# Patient Record
Sex: Male | Born: 1990 | Race: White | Hispanic: No | Marital: Single | Smoking: Current every day smoker
Health system: Southern US, Community
[De-identification: ages and names within clinical notes are randomized; demographics above are authoritative.]

## PROBLEM LIST (undated history)

## (undated) DIAGNOSIS — J189 Pneumonia, unspecified organism: Secondary | ICD-10-CM

---
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
## 2021-10-08 ENCOUNTER — Ambulatory Visit (HOSPITAL_COMMUNITY)
Admission: EM | Admit: 2021-10-08 | Discharge: 2021-10-08 | Disposition: A | Discharge status: Home / Self Care | Payer: No Typology Code available for payment source

## 2021-10-08 ENCOUNTER — Encounter (HOSPITAL_COMMUNITY): Payer: Self-pay

## 2021-10-08 DIAGNOSIS — K439 Ventral hernia without obstruction or gangrene: Secondary | ICD-10-CM

## 2021-10-08 DIAGNOSIS — R109 Unspecified abdominal pain: Secondary | ICD-10-CM

## 2021-10-08 NOTE — Discharge Instructions (Signed)
I recommend following up with your new primary care provider. They will be able to offer you further evaluation and imaging if needed.  If you develop any pain or notice a "bulge" that does not press back in, please seek further care.

## 2021-10-08 NOTE — ED Provider Notes (Signed)
[facility]    CSN: [id number] Arrival date & time: 10/08/21  1326     History   Chief Complaint Chief Complaint  Patient presents with   Hernia    HPI Jeffrey Tanner is a 31 y.o. male.  Presents with concern for possible abdominal hernia that he noticed first in April. Notices mostly with walking. Does not notice a bulge or any lump in the area. Just feels discomfort above the umbilicus.  Denies fever, abdominal pain, nausea, vomiting, diarrhea, constipation. No pain when he presses on the area. No history of hernia.  He is otherwise asymptomatic.   History reviewed. No pertinent past medical history.  There are no problems to display for this patient.  History reviewed. No pertinent surgical history.   Home Medications    Prior to Admission medications   Not on File    Family History History reviewed. No pertinent family history.  Social History Social History   Tobacco Use   Smoking status: Every Day  Substance Use Topics   Alcohol use: No     Allergies   Patient has no known allergies.   Review of Systems Review of Systems Per HPI  Physical Exam Triage Vital Signs ED Triage Vitals  Enc Vitals Group     BP 10/08/21 1453 137/69     Pulse Rate 10/08/21 1453 65     Resp 10/08/21 1453 14     Temp 10/08/21 1453 98.1 F (36.7 C)     Temp Source 10/08/21 1453 Oral     SpO2 10/08/21 1453 100 %     Weight --      Height --      Head Circumference --      Peak Flow --      Pain Score 10/08/21 1452 0     Pain Loc --      Pain Edu? --      Excl. in GC? --    No data found.  Updated Vital Signs BP 137/69 (BP Location: Left Arm)   Pulse 65   Temp 98.1 F (36.7 C) (Oral)   Resp 14   SpO2 100%    Physical Exam Vitals and nursing note reviewed.  Constitutional:      General: He is not in acute distress.    Appearance: Normal appearance.  HENT:     Mouth/Throat:     Pharynx: Oropharynx is clear.  Eyes:     Conjunctiva/sclera:  Conjunctivae normal.  Cardiovascular:     Rate and Rhythm: Normal rate and regular rhythm.     Pulses: Normal pulses.     Heart sounds: Normal heart sounds.  Pulmonary:     Effort: Pulmonary effort is normal.     Breath sounds: Normal breath sounds.  Abdominal:     General: There is no distension.     Palpations: There is no mass.     Tenderness: There is no abdominal tenderness. There is no guarding or rebound.     Hernia: No hernia is present.     Comments: Small soft area just superior to umbilicus. No mass, protrusion, lump, etc. Non tender. Almost unnoticeable  Skin:    General: Skin is warm and dry.  Neurological:     Mental Status: He is alert and oriented to person, place, and time.      UC Treatments / Results  Labs (all labs ordered are listed, but only abnormal results are displayed) Labs Reviewed - No data to display  EKG   Radiology No results found.  Procedures Procedures (including critical care time)  Medications Ordered in UC Medications - No data to display  Initial Impression / Assessment and Plan / UC Course  I have reviewed the triage vital signs and the nursing notes.  Pertinent labs & imaging results that were available during my care of the patient were reviewed by me and considered in my medical decision making (see chart for details).  Very minor almost "squishy" area could be hernia forming although no concern for any incarceration or strangulation at this time. Even with muscle flexion, no noticeable area of concern. Discussed with patient he should establish with primary care and see if they recommend further workup, at this time seems benign. Discussed signs to look for that would require emergency eval.  Tried to schedule PCP appointment in the exam room but system declined. He will schedule at home and follow up with them once established. Return precautions discussed. Patient agrees to plan and is discharged in stable condition.  Final  Clinical Impressions(s) / UC Diagnoses   Final diagnoses:  Abdominal discomfort  Hernia of abdominal wall     Discharge Instructions      I recommend following up with your new primary care provider. They will be able to offer you further evaluation and imaging if needed.  If you develop any pain or notice a "bulge" that does not press back in, please seek further care.    ED Prescriptions   None    PDMP not reviewed this encounter.   Jawan Chavarria, Lurena Joiner, [place] 10/08/21 1604

## 2021-10-08 NOTE — ED Triage Notes (Signed)
Pt presents with c/o of a possible abdominal hernia that has been present since April. Pt endorses discomfort.

## 2022-02-25 ENCOUNTER — Encounter: Payer: Self-pay | Admitting: Gastroenterology

## 2022-02-25 ENCOUNTER — Ambulatory Visit (INDEPENDENT_AMBULATORY_CARE_PROVIDER_SITE_OTHER): Payer: No Typology Code available for payment source | Admitting: Gastroenterology

## 2022-02-25 VITALS — BP 127/74 | HR 105 | Temp 99.4°F | Ht 71.0 in | Wt 159.8 lb

## 2022-02-25 DIAGNOSIS — K429 Umbilical hernia without obstruction or gangrene: Secondary | ICD-10-CM

## 2022-02-25 DIAGNOSIS — R1033 Periumbilical pain: Secondary | ICD-10-CM | POA: Diagnosis not present

## 2022-02-25 NOTE — Progress Notes (Signed)
    Wyline Mood MD, MRCP(U.K) [address]  [address]  [place], [place] [address]  Main: [phone]  Fax: [phone]   Gastroenterology Consultation  Referring Provider:   Emergency room Primary Care Physician:  Pcp, No Primary Gastroenterologist:  Dr. Wyline Mood  Reason for Consultation: GI symptoms        HPI:   Jeffrey Tanner is a 31 y.o. y/o male was seen in the ER on 10/08/2021 for a hernia.  He complained to the emergency room that he had some discomfort over his umbilicus.  No imaging was performed was discharged and asked to see me I have reviewed care everywhere no labs or imaging  He states the main issue he is here today to see me is for a bulge on his abdominal wall just above his umbilicus noticed for the past few months does not cause any pain no discomfort has noticed that recently he lifts heavy loads. No past medical history on file.  No past surgical history on file.  Prior to Admission medications   Not on File    No family history on file.   Social History   Tobacco Use   Smoking status: Every Day  Substance Use Topics   Alcohol use: No    Allergies as of 02/25/2022   (No Known Allergies)    Review of Systems:    All systems reviewed and negative except where noted in HPI.   Physical Exam:  BP 127/74   Pulse (!) 105   Temp 99.4 F (37.4 C) (Oral)   Ht 5\' 11"  (1.803 m)   Wt 159 lb 12.8 oz (72.5 kg)   BMI 22.29 kg/m  No LMP for male patient. Psych:  Alert and cooperative. Normal mood and affect. General:   Alert,  Well-developed, well-nourished, pleasant and cooperative in NAD Head:  Normocephalic and atraumatic. Eyes:  Sclera clear, no icterus.   Conjunctiva pink. Ears:  Normal auditory acuity.  Abdomen: Exam the area just above his umbilicus he has very toned muscles particularly the rectus abdominis.  I could not particularly feel any bulge on flexion of at his hip when he tried to sit up or whether he coughed.  There was probably  gap in the midline but no palpable mass Neurologic:  Alert and oriented x3;  grossly normal neurologically. Psych:  Alert and cooperative. Normal mood and affect.  Imaging Studies: No results found.  Assessment and Plan:   Jeffrey Tanner is a 31 y.o. y/o male has been referred for a possible hernia on examination no abnormalities felt in the area perceived by the patient about the umbilicus I believe this may be a defect in the fascia and the 2 layers of the rectal abdominis muscle converging.  I cannot feel any palpable abnormality to reassure him I will get focused ultrasound of this area and confirm my findings  Follow up in 8 to 12 weeks  Dr 37 MD,MRCP(U.K)

## 2022-02-25 NOTE — Patient Instructions (Signed)
[facility] at 7:45 AM. Please do not eat or drink after midnight. If you need to reschedule, please call [phone].

## 2022-02-28 ENCOUNTER — Ambulatory Visit
Admission: RE | Admit: 2022-02-28 | Discharge: 2022-02-28 | Disposition: A | Discharge status: Home / Self Care | Payer: No Typology Code available for payment source | Source: Ambulatory Visit | Attending: Gastroenterology | Admitting: Gastroenterology

## 2022-02-28 DIAGNOSIS — R1033 Periumbilical pain: Secondary | ICD-10-CM | POA: Diagnosis not present

## 2022-02-28 NOTE — Progress Notes (Signed)
Inform he has a hernia and if he wishes we can refer him to Dr Aleen Campi for surgeical repair. No follow up with me needed

## 2022-03-04 ENCOUNTER — Telehealth: Payer: Self-pay

## 2022-03-04 NOTE — Telephone Encounter (Signed)
-----   Message from Wyline Mood, MD sent at 02/28/2022  1:03 PM EST ----- Inform he has a hernia and if he wishes we can refer him to Dr Aleen Campi for surgeical repair. No follow up with me needed

## 2022-03-04 NOTE — Telephone Encounter (Signed)
Called patient but had to leave him a detailed message letting him know that the abdominal ultrasound showed that he had an umbilical hernia. I also asked for him to call me back if he wanted to be referred to a general surgeon so it could get repaired.

## 2022-04-03 NOTE — Addendum Note (Signed)
Addended by: Wayna Chalet on: 04/03/2022 10:27 AM   Modules accepted: Orders

## 2022-04-10 ENCOUNTER — Encounter: Payer: Self-pay | Admitting: Surgery

## 2022-04-10 ENCOUNTER — Ambulatory Visit (INDEPENDENT_AMBULATORY_CARE_PROVIDER_SITE_OTHER): Payer: 59 | Admitting: Surgery

## 2022-04-10 ENCOUNTER — Telehealth: Payer: Self-pay | Admitting: Surgery

## 2022-04-10 VITALS — BP 136/85 | HR 93 | Temp 98.2°F | Ht 73.0 in | Wt 161.0 lb

## 2022-04-10 DIAGNOSIS — K429 Umbilical hernia without obstruction or gangrene: Secondary | ICD-10-CM

## 2022-04-10 NOTE — Patient Instructions (Addendum)
Our surgery scheduler Barbara will call you within 24-48 hours to get you scheduled. If you have not heard from her after 48 hours, please call our office. Have the blue sheet available when she calls to write down important information.   If you have any concerns or questions, please feel free to call our office.   Umbilical Hernia, Adult  A hernia is a bulge of tissue that pushes through an opening between muscles. An umbilical hernia happens in the abdomen, near the belly button (umbilicus). The hernia may contain tissues from the small intestine, large intestine, or fatty tissue covering the intestines. Umbilical hernias in adults tend to get worse over time, and they require surgical treatment. There are different types of umbilical hernias, including: Indirect hernia. This type is located just above or below the umbilicus. It is the most common type of umbilical hernia in adults. Direct hernia. This type forms through an opening formed by the umbilicus. Reducible hernia. This type of hernia comes and goes. It may be visible only when you strain, lift something heavy, or cough. This type of hernia can be pushed back into the abdomen (reduced). Incarcerated hernia. This type traps abdominal tissue inside the hernia. This type of hernia cannot be reduced. Strangulated hernia. This type of hernia cuts off blood flow to the tissues inside the hernia. The tissues can start to die if this happens. This type of hernia requires emergency treatment. What are the causes? An umbilical hernia happens when tissue inside the abdomen presses on a weak area of the abdominal muscles. What increases the risk? You may have a greater risk of this condition if you: Are obese. Have had several pregnancies. Have a buildup of fluid inside your abdomen. Have had surgery that weakens the abdominal muscles. What are the signs or symptoms? The main symptom of this condition is a painless bulge at or near the belly  button. A reducible hernia may be visible only when you strain, lift something heavy, or cough. Other symptoms may include: Dull pain. A feeling of pressure. Symptoms of a strangulated hernia may include: Pain that gets increasingly worse. Nausea and vomiting. Pain when pressing on the hernia. Skin over the hernia becoming red or purple. Constipation. Blood in the stool. How is this diagnosed? This condition may be diagnosed based on: A physical exam. You may be asked to cough or strain while standing. These actions increase the pressure inside your abdomen and can force the hernia through the opening in your muscles. Your health care provider may try to reduce the hernia by pressing on it. Your symptoms and medical history. How is this treated? Surgery is the only treatment for an umbilical hernia. Surgery for a strangulated hernia is done as soon as possible. If you have a small hernia that is not incarcerated, you may need to lose weight before having surgery. Follow these instructions at home: Lose weight, if told by your health care provider. Do not try to push the hernia back in. Watch your hernia for any changes in color or size. Tell your health care provider if any changes occur. You may need to avoid activities that increase pressure on your hernia. Do not lift anything that is heavier than 10 lb (4.5 kg), or the limit that you are told, until your health care provider says that it is safe. Take over-the-counter and prescription medicines only as told by your health care provider. Keep all follow-up visits. This is important. Contact a health care   provider if: Your hernia gets larger. Your hernia becomes painful. Get help right away if: You develop sudden, severe pain near the area of your hernia. You have pain as well as nausea or vomiting. You have pain and the skin over your hernia changes color. You develop a fever or chills. Summary A hernia is a bulge of tissue that  pushes through an opening between muscles. An umbilical hernia happens near the belly button. Surgery is the only treatment for an umbilical hernia. Do not try to push your hernia back in. Keep all follow-up visits. This is important. This information is not intended to replace advice given to you by your health care provider. Make sure you discuss any questions you have with your health care provider. Document Revised: 10/25/2019 Document Reviewed: 10/25/2019 Elsevier Patient Education  2023 Elsevier Inc.  

## 2022-04-10 NOTE — Progress Notes (Signed)
04/10/2022  Reason for Visit: Umbilical hernia  Requesting Provider: Jonathon Bellows, MD  History of Present Illness: Jeffrey Tanner is a 32 y.o. male presenting for evaluation of an umbilical hernia.  The patient reports that since about April 2023 he has noted an area of discomfort at the umbilicus associated with bulging that is intermittent.  He works in [employer] and does heavy lifting as part of his job.  He reports that sometimes the area at the umbilicus is bulging but sometimes is reduced.  He denies any episodes of severe pain or firmness at the umbilicus.  Denies any nausea or vomiting, constipation or diarrhea.  He saw Dr. Vicente Males on 02/25/2022 and ultrasound of his abdomen was obtained on 02/28/2022 which showed a fat-containing umbilical hernia with a small defect size of 9 mm.  Past Medical History: History reviewed. No pertinent past medical history.   Past Surgical History: History reviewed. No pertinent surgical history.  Home Medications: Prior to Admission medications   None    Allergies: No Known Allergies  Social History:  reports that he has been smoking. He does not have any smokeless tobacco history on file. He reports that he does not drink alcohol. No history on file for drug use.   Family History: History reviewed. No pertinent family history.  Review of Systems: Review of Systems  Constitutional:  Negative for chills and fever.  HENT:  Negative for hearing loss.   Respiratory:  Negative for shortness of breath.   Cardiovascular:  Negative for chest pain.  Gastrointestinal:  Positive for abdominal pain. Negative for constipation, diarrhea, nausea and vomiting.  Genitourinary:  Negative for dysuria.  Musculoskeletal:  Negative for myalgias.  Skin:  Negative for rash.  Neurological:  Negative for dizziness.  Psychiatric/Behavioral:  Negative for depression.     Physical Exam BP 136/85   Pulse 93   Temp 98.2 F (36.8 C) (Oral)   Ht 6\' 1"  (1.854 m)    Wt 161 lb (73 kg)   SpO2 98%   BMI 21.24 kg/m  CONSTITUTIONAL: No acute distress, well-nourished HEENT:  Normocephalic, atraumatic, extraocular motion intact. NECK: Trachea is midline, and there is no jugular venous distension.  RESPIRATORY:  Lungs are clear, and breath sounds are equal bilaterally. Normal respiratory effort without pathologic use of accessory muscles. CARDIOVASCULAR: Heart is regular without murmurs, gallops, or rubs. GI: The abdomen is soft, nondistended, with mild discomfort to palpation at the umbilicus.  The patient has a small reducible hernia defect at the umbilicus.  No overlying skin ulceration.  MUSCULOSKELETAL:  Normal muscle strength and tone in all four extremities.  No peripheral edema or cyanosis. SKIN: Skin turgor is normal. There are no pathologic skin lesions.  NEUROLOGIC:  Motor and sensation is grossly normal.  Cranial nerves are grossly intact. PSYCH:  Alert and oriented to person, place and time. Affect is normal.  Laboratory Analysis: No results found for this or any previous visit (from the past 24 hour(s)).  Imaging: Ultrasound abdomen on 02/28/2022: FINDINGS: A fat containing umbilical hernia is identified. The hernia sac measures 2.1 x 2.3 x 0.9 cm.   IMPRESSION: Fat containing umbilical hernia as above. Hernia sac measures up to 2.3 cm and the ventral defect measures 9 mm.  Assessment and Plan: This is a 33 y.o. male with a reducible small umbilical hernia.  - Discussed with patient the findings on his ultrasound as well as physical exam.  He has a small and reducible umbilical hernia defect.  This  does cause intermittent discomfort he has noticed this ongoing since about 9 months ago.  Discussed with patient the potential options for watchful waiting although given his line of work, there is a risk that the hernia will continue to grow or become more symptomatic, versus surgical management.  He would rather take care of this now so this  does not affect his work later. - Discussed with him the role for a robotic assisted umbilical hernia repair.  Discussed with him the difference between doing this robotically versus open.  Reviewed the surgery at length with him including the planned incisions, risks of bleeding, infection, injury to surrounding structures, that this would be an outpatient procedure, postoperative activity restrictions, pain control, and he is willing to proceed. - We will schedule him for surgery on 04/16/2022.  All of his questions have been answered.  I spent 40 minutes dedicated to the care of this patient on the date of this encounter to include pre-visit review of records, face-to-face time with the patient discussing diagnosis and management, and any post-visit coordination of care.   Melvyn Neth, [facility]

## 2022-04-10 NOTE — Telephone Encounter (Signed)
Left message for patient to call, please inform him of the following regarding scheduled surgery with Dr. Hampton Abbot.    Pre-Admission date/time, and Surgery date at [facility].  Surgery Date: 04/16/22 Preadmission Testing Date: 04/15/22 (phone 8a-1p)  Also patient will need to call at [phone], between 1-3:00pm the day before surgery, to find out what time to arrive for surgery.

## 2022-04-10 NOTE — H&P (View-Only) (Signed)
04/10/2022  Reason for Visit: Umbilical hernia  Requesting Provider: Kiran Anna, MD  History of Present Illness: Jeffrey Tanner is a 31 y.o. male presenting for evaluation of an umbilical hernia.  The patient reports that since about April 2023 he has noted an area of discomfort at the umbilicus associated with bulging that is intermittent.  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  He reports that sometimes the area at the umbilicus is bulging but sometimes is reduced.  He denies any episodes of severe pain or firmness at the umbilicus.  Denies any nausea or vomiting, constipation or diarrhea.  He saw Dr. Anna on 02/25/2022 and ultrasound of his abdomen was obtained on 02/28/2022 which showed a fat-containing umbilical hernia with a small defect size of 9 mm.  Past Medical History: History reviewed. No pertinent past medical history.   Past Surgical History: History reviewed. No pertinent surgical history.  Home Medications: Prior to Admission medications   None    Allergies: No Known Allergies  Social History:  reports that he has been smoking. He does not have any smokeless tobacco history on file. He reports that he does not drink alcohol. No history on file for drug use.   Family History: History reviewed. No pertinent family history.  Review of Systems: Review of Systems  Constitutional:  Negative for chills and fever.  HENT:  Negative for hearing loss.   Respiratory:  Negative for shortness of breath.   Cardiovascular:  Negative for chest pain.  Gastrointestinal:  Positive for abdominal pain. Negative for constipation, diarrhea, nausea and vomiting.  Genitourinary:  Negative for dysuria.  Musculoskeletal:  Negative for myalgias.  Skin:  Negative for rash.  Neurological:  Negative for dizziness.  Psychiatric/Behavioral:  Negative for depression.     Physical Exam BP 136/85   Pulse 93   Temp 98.2 F (36.8 C) (Oral)   Ht 6' 1" (1.854 m)    Wt 161 lb (73 kg)   SpO2 98%   BMI 21.24 kg/m  CONSTITUTIONAL: No acute distress, well-nourished HEENT:  Normocephalic, atraumatic, extraocular motion intact. NECK: Trachea is midline, and there is no jugular venous distension.  RESPIRATORY:  Lungs are clear, and breath sounds are equal bilaterally. Normal respiratory effort without pathologic use of accessory muscles. CARDIOVASCULAR: Heart is regular without murmurs, gallops, or rubs. GI: The abdomen is soft, nondistended, with mild discomfort to palpation at the umbilicus.  The patient has a small reducible hernia defect at the umbilicus.  No overlying skin ulceration.  MUSCULOSKELETAL:  Normal muscle strength and tone in all four extremities.  No peripheral edema or cyanosis. SKIN: Skin turgor is normal. There are no pathologic skin lesions.  NEUROLOGIC:  Motor and sensation is grossly normal.  Cranial nerves are grossly intact. PSYCH:  Alert and oriented to person, place and time. Affect is normal.  Laboratory Analysis: No results found for this or any previous visit (from the past 24 hour(s)).  Imaging: Ultrasound abdomen on 02/28/2022: FINDINGS: A fat containing umbilical hernia is identified. The hernia sac measures 2.1 x 2.3 x 0.9 cm.   IMPRESSION: Fat containing umbilical hernia as above. Hernia sac measures up to 2.3 cm and the ventral defect measures 9 mm.  Assessment and Plan: This is a 31 y.o. male with a reducible small umbilical hernia.  - Discussed with patient the findings on his ultrasound as well as physical exam.  He has a small and reducible umbilical hernia defect.  This   does cause intermittent discomfort he has noticed this ongoing since about 9 months ago.  Discussed with patient the potential options for watchful waiting although given his line of work, there is a risk that the hernia will continue to grow or become more symptomatic, versus surgical management.  He would rather take care of this now so this  does not affect his work later. - Discussed with him the role for a robotic assisted umbilical hernia repair.  Discussed with him the difference between doing this robotically versus open.  Reviewed the surgery at length with him including the planned incisions, risks of bleeding, infection, injury to surrounding structures, that this would be an outpatient procedure, postoperative activity restrictions, pain control, and he is willing to proceed. - We will schedule him for surgery on 04/16/2022.  All of his questions have been answered.  I spent 40 minutes dedicated to the care of this patient on the date of this encounter to include pre-visit review of records, face-to-face time with the patient discussing diagnosis and management, and any post-visit coordination of care.   Melvyn Neth, [facility]

## 2022-04-11 NOTE — Telephone Encounter (Signed)
Patient calls back, he is now informed of all dates regarding his surgery.   

## 2022-04-11 NOTE — Telephone Encounter (Signed)
Left another message for patient to call.

## 2022-04-11 NOTE — Telephone Encounter (Signed)
Patient called back and was given surgery information. °

## 2022-04-15 ENCOUNTER — Encounter
Admission: RE | Admit: 2022-04-15 | Discharge: 2022-04-15 | Disposition: A | Discharge status: Home / Self Care | Payer: 59 | Source: Ambulatory Visit | Attending: Surgery | Admitting: Surgery

## 2022-04-15 ENCOUNTER — Other Ambulatory Visit: Payer: Self-pay

## 2022-04-15 HISTORY — DX: Pneumonia, unspecified organism: J18.9

## 2022-04-15 MED ORDER — GABAPENTIN 300 MG PO CAPS: 300.0000 mg | ORAL_CAPSULE | ORAL | Status: AC

## 2022-04-15 MED ORDER — FAMOTIDINE 20 MG PO TABS: 20.0000 mg | ORAL_TABLET | Freq: Once | ORAL | Status: AC

## 2022-04-15 MED ORDER — CHLORHEXIDINE GLUCONATE 0.12 % MT SOLN: 15.0000 mL | Freq: Once | OROMUCOSAL | Status: DC

## 2022-04-15 MED ORDER — CHLORHEXIDINE GLUCONATE CLOTH 2 % EX PADS: 6.0000 | MEDICATED_PAD | Freq: Once | CUTANEOUS | Status: DC

## 2022-04-15 MED ORDER — ORAL CARE MOUTH RINSE: 15.0000 mL | Freq: Once | OROMUCOSAL | Status: DC

## 2022-04-15 MED ORDER — CEFAZOLIN SODIUM-DEXTROSE 2-4 GM/100ML-% IV SOLN
2.0000 g | INTRAVENOUS | Status: AC
  Administered 2022-04-16: 2 g via INTRAVENOUS

## 2022-04-15 MED ORDER — BUPIVACAINE LIPOSOME 1.3 % IJ SUSP: 20.0000 mL | Freq: Once | INTRAMUSCULAR | Status: DC

## 2022-04-15 MED ORDER — LACTATED RINGERS IV SOLN: INTRAVENOUS | Status: DC

## 2022-04-15 MED ORDER — ACETAMINOPHEN 500 MG PO TABS: 1000.0000 mg | ORAL_TABLET | ORAL | Status: AC

## 2022-04-15 NOTE — Patient Instructions (Addendum)
Your procedure is scheduled on: 04/16/22 - Tuesday Report to the Registration Desk on the 1st floor of the [facility]. To find out your arrival time, please call [phone] between 1PM - 3PM on: 04/15/22 - Monday If your arrival time is 6:00 am, do not arrive prior to that time as the [facility] entrance doors do not open until 6:00 am.  REMEMBER: Instructions that are not followed completely may result in serious medical risk, up to and including death; or upon the discretion of your surgeon and anesthesiologist your surgery may need to be rescheduled.  Do not eat food after midnight the night before surgery.  No gum chewing, lozengers or hard candies.  You may however, drink CLEAR liquids up to 2 hours before you are scheduled to arrive for your surgery. Do not drink anything within 2 hours of your scheduled arrival time.  Clear liquids include: - water  - apple juice without pulp - gatorade (not RED colors) - black coffee or tea (Do NOT add milk or creamers to the coffee or tea) Do NOT drink anything that is not on this list.  TAKE THESE MEDICATIONS THE MORNING OF SURGERY WITH A SIP OF WATER: NONE  One week prior to surgery: Stop Anti-inflammatories (NSAIDS) such as Advil, Aleve, Ibuprofen, Motrin, Naproxen, Naprosyn and Aspirin based products such as Excedrin, Goodys Powder, BC Powder.  Stop ANY OVER THE COUNTER supplements until after surgery.  You may however, continue to take Tylenol if needed for pain up until the day of surgery.  No Alcohol for 24 hours before or after surgery.  No Smoking including e-cigarettes for 24 hours prior to surgery.  No chewable tobacco products for at least 6 hours prior to surgery.  No nicotine patches on the day of surgery.  Do not use any "recreational" drugs for at least a week prior to your surgery.  Please be advised that the combination of cocaine and anesthesia may have negative outcomes, up to and including death. If you test  positive for cocaine, your surgery will be cancelled.  On the morning of surgery brush your teeth with toothpaste and water, you may rinse your mouth with mouthwash if you wish. Do not swallow any toothpaste or mouthwash.  Use CHG Soap or wipes as directed on instruction sheet.  Do not wear jewelry, make-up, hairpins, clips or nail polish.  Do not wear lotions, powders, or perfumes.   Do not shave body from the neck down 48 hours prior to surgery just in case you cut yourself which could leave a site for infection.  Also, freshly shaved skin may become irritated if using the CHG soap.  Contact lenses, hearing aids and dentures may not be worn into surgery.  Do not bring valuables to the hospital. [facility] is not responsible for any missing/lost belongings or valuables.   Notify your doctor if there is any change in your medical condition (cold, fever, infection).  Wear comfortable clothing (specific to your surgery type) to the hospital.  After surgery, you can help prevent lung complications by doing breathing exercises.  Take deep breaths and cough every 1-2 hours. Your doctor may order a device called an Incentive Spirometer to help you take deep breaths. When coughing or sneezing, hold a pillow firmly against your incision with both hands. This is called "splinting." Doing this helps protect your incision. It also decreases belly discomfort.  If you are being admitted to the hospital overnight, leave your suitcase in the car.  After surgery it may be brought to your room.  If you are being discharged the day of surgery, you will not be allowed to drive home. You will need a responsible adult (18 years or older) to drive you home and stay with you that night.   If you are taking public transportation, you will need to have a responsible adult (18 years or older) with you. Please confirm with your physician that it is acceptable to use public transportation.   Please call the  [facility] Dept. at [phone] if you have any questions about these instructions.  Surgery Visitation Policy:  Patients undergoing a surgery or procedure may have two family members or support persons with them as long as the person is not COVID-19 positive or experiencing its symptoms.   Inpatient Visitation:    Visiting hours are 7 a.m. to 8 p.m. Up to four visitors are allowed at one time in a patient room. The visitors may rotate out with other people during the day. One designated support person (adult) may remain overnight.  Due to an increase in RSV and influenza rates and associated hospitalizations, children ages 43 and under will not be able to visit patients in [facility]. Masks continue to be strongly recommended.

## 2022-04-16 ENCOUNTER — Ambulatory Visit: Payer: 59 | Admitting: Certified Registered"

## 2022-04-16 ENCOUNTER — Encounter: Payer: Self-pay | Admitting: Surgery

## 2022-04-16 ENCOUNTER — Other Ambulatory Visit: Payer: Self-pay

## 2022-04-16 ENCOUNTER — Encounter
Admission: RE | Disposition: A | Discharge status: Home / Self Care | Payer: Self-pay | Source: Home / Self Care | Attending: Surgery

## 2022-04-16 ENCOUNTER — Ambulatory Visit
Admission: RE | Admit: 2022-04-16 | Discharge: 2022-04-16 | Disposition: A | Discharge status: Home / Self Care | Payer: 59 | Attending: Surgery | Admitting: Surgery

## 2022-04-16 DIAGNOSIS — F17210 Nicotine dependence, cigarettes, uncomplicated: Secondary | ICD-10-CM | POA: Diagnosis not present

## 2022-04-16 DIAGNOSIS — K439 Ventral hernia without obstruction or gangrene: Secondary | ICD-10-CM | POA: Diagnosis not present

## 2022-04-16 DIAGNOSIS — K429 Umbilical hernia without obstruction or gangrene: Secondary | ICD-10-CM | POA: Diagnosis present

## 2022-04-16 HISTORY — PX: INSERTION OF MESH: SHX5868

## 2022-04-16 SURGERY — REPAIR, HERNIA, UMBILICAL, ROBOT-ASSISTED
Anesthesia: General | Site: Abdomen

## 2022-04-16 MED ORDER — MIDAZOLAM HCL 2 MG/2ML IJ SOLN
INTRAMUSCULAR | Status: DC | PRN
  Administered 2022-04-16: 2 mg via INTRAVENOUS

## 2022-04-16 MED ORDER — FENTANYL CITRATE (PF) 100 MCG/2ML IJ SOLN
INTRAMUSCULAR | Status: AC
  Administered 2022-04-16: 50 ug via INTRAVENOUS
  Filled 2022-04-16: qty 2

## 2022-04-16 MED ORDER — MIDAZOLAM HCL 2 MG/2ML IJ SOLN
INTRAMUSCULAR | Status: AC
  Filled 2022-04-16: qty 2

## 2022-04-16 MED ORDER — DEXMEDETOMIDINE HCL IN NACL 80 MCG/20ML IV SOLN
INTRAVENOUS | Status: DC | PRN
  Administered 2022-04-16: 4 ug via BUCCAL
  Administered 2022-04-16 (×2): 8 ug via BUCCAL

## 2022-04-16 MED ORDER — SUGAMMADEX SODIUM 200 MG/2ML IV SOLN
INTRAVENOUS | Status: DC | PRN
  Administered 2022-04-16: 200 mg via INTRAVENOUS

## 2022-04-16 MED ORDER — SODIUM CHLORIDE (PF) 0.9 % IJ SOLN
INTRAMUSCULAR | Status: DC | PRN
  Administered 2022-04-16: 60 mL via INTRAMUSCULAR

## 2022-04-16 MED ORDER — BUPIVACAINE LIPOSOME 1.3 % IJ SUSP
INTRAMUSCULAR | Status: AC
  Filled 2022-04-16: qty 20

## 2022-04-16 MED ORDER — ACETAMINOPHEN 500 MG PO TABS: 1000.0000 mg | ORAL_TABLET | Freq: Four times a day (QID) | ORAL | Status: AC | PRN

## 2022-04-16 MED ORDER — LACTATED RINGERS IV SOLN: INTRAVENOUS | Status: DC | PRN

## 2022-04-16 MED ORDER — ROCURONIUM BROMIDE 100 MG/10ML IV SOLN
INTRAVENOUS | Status: DC | PRN
  Administered 2022-04-16: 40 mg via INTRAVENOUS
  Administered 2022-04-16 (×2): 20 mg via INTRAVENOUS

## 2022-04-16 MED ORDER — CEFAZOLIN SODIUM-DEXTROSE 2-4 GM/100ML-% IV SOLN
INTRAVENOUS | Status: AC
  Filled 2022-04-16: qty 100

## 2022-04-16 MED ORDER — FENTANYL CITRATE (PF) 100 MCG/2ML IJ SOLN
25.0000 ug | INTRAMUSCULAR | Status: DC | PRN
  Administered 2022-04-16 (×2): 25 ug via INTRAVENOUS

## 2022-04-16 MED ORDER — OXYCODONE HCL 5 MG PO TABS: 5.0000 mg | ORAL_TABLET | ORAL | 0 refills | Status: AC | PRN

## 2022-04-16 MED ORDER — OXYCODONE HCL 5 MG PO TABS
5.0000 mg | ORAL_TABLET | Freq: Once | ORAL | Status: AC | PRN
  Administered 2022-04-16: 5 mg via ORAL

## 2022-04-16 MED ORDER — IBUPROFEN 800 MG PO TABS: 800.0000 mg | ORAL_TABLET | Freq: Three times a day (TID) | ORAL | 1 refills | Status: AC | PRN

## 2022-04-16 MED ORDER — DEXAMETHASONE SODIUM PHOSPHATE 10 MG/ML IJ SOLN
INTRAMUSCULAR | Status: DC | PRN
  Administered 2022-04-16: 10 mg via INTRAVENOUS

## 2022-04-16 MED ORDER — KETOROLAC TROMETHAMINE 30 MG/ML IJ SOLN
INTRAMUSCULAR | Status: DC | PRN
  Administered 2022-04-16: 30 mg via INTRAVENOUS

## 2022-04-16 MED ORDER — GABAPENTIN 300 MG PO CAPS
ORAL_CAPSULE | ORAL | Status: AC
  Administered 2022-04-16: 300 mg via ORAL
  Filled 2022-04-16: qty 1

## 2022-04-16 MED ORDER — ONDANSETRON HCL 4 MG/2ML IJ SOLN
INTRAMUSCULAR | Status: DC | PRN
  Administered 2022-04-16: 4 mg via INTRAVENOUS

## 2022-04-16 MED ORDER — ONDANSETRON HCL 4 MG/2ML IJ SOLN: 4.0000 mg | Freq: Once | INTRAMUSCULAR | Status: DC | PRN

## 2022-04-16 MED ORDER — OXYCODONE HCL 5 MG/5ML PO SOLN: 5.0000 mg | Freq: Once | ORAL | Status: AC | PRN

## 2022-04-16 MED ORDER — OXYCODONE HCL 5 MG PO TABS
ORAL_TABLET | ORAL | Status: AC
  Filled 2022-04-16: qty 1

## 2022-04-16 MED ORDER — LACTATED RINGERS IV SOLN: INTRAVENOUS | Status: DC

## 2022-04-16 MED ORDER — FENTANYL CITRATE (PF) 100 MCG/2ML IJ SOLN
INTRAMUSCULAR | Status: DC | PRN
  Administered 2022-04-16 (×2): 50 ug via INTRAVENOUS

## 2022-04-16 MED ORDER — SODIUM CHLORIDE FLUSH 0.9 % IV SOLN
INTRAVENOUS | Status: AC
  Filled 2022-04-16: qty 30

## 2022-04-16 MED ORDER — HYDROMORPHONE HCL 1 MG/ML IJ SOLN
INTRAMUSCULAR | Status: AC
  Filled 2022-04-16: qty 1

## 2022-04-16 MED ORDER — CHLORHEXIDINE GLUCONATE 0.12 % MT SOLN
OROMUCOSAL | Status: AC
  Filled 2022-04-16: qty 15

## 2022-04-16 MED ORDER — ACETAMINOPHEN 10 MG/ML IV SOLN: 1000.0000 mg | Freq: Once | INTRAVENOUS | Status: DC | PRN

## 2022-04-16 MED ORDER — FENTANYL CITRATE (PF) 100 MCG/2ML IJ SOLN
INTRAMUSCULAR | Status: AC
  Filled 2022-04-16: qty 2

## 2022-04-16 MED ORDER — LIDOCAINE HCL (CARDIAC) PF 100 MG/5ML IV SOSY
PREFILLED_SYRINGE | INTRAVENOUS | Status: DC | PRN
  Administered 2022-04-16: 80 mg via INTRAVENOUS

## 2022-04-16 MED ORDER — ACETAMINOPHEN 500 MG PO TABS
ORAL_TABLET | ORAL | Status: AC
  Administered 2022-04-16: 1000 mg via ORAL
  Filled 2022-04-16: qty 2

## 2022-04-16 MED ORDER — 0.9 % SODIUM CHLORIDE (POUR BTL) OPTIME
TOPICAL | Status: DC | PRN
  Administered 2022-04-16: 500 mL

## 2022-04-16 MED ORDER — FAMOTIDINE 20 MG PO TABS
ORAL_TABLET | ORAL | Status: AC
  Administered 2022-04-16: 20 mg via ORAL
  Filled 2022-04-16: qty 1

## 2022-04-16 MED ORDER — BUPIVACAINE-EPINEPHRINE (PF) 0.5% -1:200000 IJ SOLN
INTRAMUSCULAR | Status: AC
  Filled 2022-04-16: qty 30

## 2022-04-16 MED ORDER — HYDROMORPHONE HCL 1 MG/ML IJ SOLN
INTRAMUSCULAR | Status: DC | PRN
  Administered 2022-04-16 (×2): .5 mg via INTRAVENOUS

## 2022-04-16 MED ORDER — PROPOFOL 10 MG/ML IV BOLUS
INTRAVENOUS | Status: DC | PRN
  Administered 2022-04-16: 100 mg via INTRAVENOUS

## 2022-04-16 SURGICAL SUPPLY — 68 items
ADH SKN CLS APL DERMABOND .7 (GAUZE/BANDAGES/DRESSINGS) ×2
BLADE CLIPPER SURG (BLADE) IMPLANT
BLADE SURG SZ11 CARB STEEL (BLADE) ×3 IMPLANT
CANNULA CAP OBTURATR AIRSEAL 8 (CAP) IMPLANT
CANNULA REDUC XI 12-8 STAPL (CANNULA) ×2
CANNULA REDUCER 12-8 DVNC XI (CANNULA) ×3 IMPLANT
COVER LIGHT HANDLE STERIS (MISCELLANEOUS) IMPLANT
COVER TIP SHEARS 8 DVNC (MISCELLANEOUS) ×3 IMPLANT
COVER TIP SHEARS 8MM DA VINCI (MISCELLANEOUS) ×2
COVER WAND RF STERILE (DRAPES) ×3 IMPLANT
DERMABOND ADVANCED .7 DNX12 (GAUZE/BANDAGES/DRESSINGS) ×3 IMPLANT
DRAPE ARM DVNC X/XI (DISPOSABLE) ×9 IMPLANT
DRAPE COLUMN DVNC XI (DISPOSABLE) ×3 IMPLANT
DRAPE DA VINCI XI ARM (DISPOSABLE) ×8
DRAPE DA VINCI XI COLUMN (DISPOSABLE) ×2
ELECT CAUTERY BLADE TIP 2.5 (TIP) ×2
ELECT REM PT RETURN 9FT ADLT (ELECTROSURGICAL) ×2
ELECTRODE CAUTERY BLDE TIP 2.5 (TIP) ×3 IMPLANT
ELECTRODE REM PT RTRN 9FT ADLT (ELECTROSURGICAL) ×3 IMPLANT
GLOVE SURG SYN 7.0 (GLOVE) ×4 IMPLANT
GLOVE SURG SYN 7.0 PF PI (GLOVE) ×6 IMPLANT
GLOVE SURG SYN 7.5  E (GLOVE) ×4
GLOVE SURG SYN 7.5 E (GLOVE) ×4 IMPLANT
GLOVE SURG SYN 7.5 PF PI (GLOVE) ×6 IMPLANT
GOWN STRL REUS W/ TWL LRG LVL3 (GOWN DISPOSABLE) ×9 IMPLANT
GOWN STRL REUS W/TWL LRG LVL3 (GOWN DISPOSABLE) ×6
GRASPER SUT TROCAR 14GX15 (MISCELLANEOUS) ×3 IMPLANT
IRRIGATION STRYKERFLOW (MISCELLANEOUS) IMPLANT
IRRIGATOR STRYKERFLOW (MISCELLANEOUS)
IV NS 1000ML (IV SOLUTION)
IV NS 1000ML BAXH (IV SOLUTION) IMPLANT
KIT PINK PAD W/HEAD ARE REST (MISCELLANEOUS) ×2
KIT PINK PAD W/HEAD ARM REST (MISCELLANEOUS) ×3 IMPLANT
LABEL OR SOLS (LABEL) ×3 IMPLANT
MANIFOLD NEPTUNE II (INSTRUMENTS) ×3 IMPLANT
MESH VENTRALIGHT ST 4.5 ECHO (Mesh General) IMPLANT
NDL INSUFFLATION 14GA 120MM (NEEDLE) ×3 IMPLANT
NEEDLE HYPO 22GX1.5 SAFETY (NEEDLE) ×3 IMPLANT
NEEDLE INSUFFLATION 14GA 120MM (NEEDLE) ×2 IMPLANT
OBTURATOR OPTICAL STANDARD 8MM (TROCAR) ×2
OBTURATOR OPTICAL STND 8 DVNC (TROCAR) ×2
OBTURATOR OPTICALSTD 8 DVNC (TROCAR) ×3 IMPLANT
PACK LAP CHOLECYSTECTOMY (MISCELLANEOUS) ×3 IMPLANT
SEAL CANN UNIV 5-8 DVNC XI (MISCELLANEOUS) ×6 IMPLANT
SEAL XI 5MM-8MM UNIVERSAL (MISCELLANEOUS) ×4
SET TUBE FILTERED XL AIRSEAL (SET/KITS/TRAYS/PACK) IMPLANT
SET TUBE SMOKE EVAC HIGH FLOW (TUBING) ×3 IMPLANT
SOLUTION ELECTROLUBE (MISCELLANEOUS) ×3 IMPLANT
SPONGE T-LAP 18X18 ~~LOC~~+RFID (SPONGE) ×3 IMPLANT
STAPLER CANNULA SEAL DVNC XI (STAPLE) ×3 IMPLANT
STAPLER CANNULA SEAL XI (STAPLE) ×2
SUT MNCRL 4-0 (SUTURE) ×4
SUT MNCRL 4-0 27XMFL (SUTURE) ×4
SUT STRATAFIX 0 PDS+ CT-2 23 (SUTURE) ×2
SUT STRATAFIX PDS 30 CT-1 (SUTURE) ×3 IMPLANT
SUT VIC AB 3-0 SH 27 (SUTURE) ×2
SUT VIC AB 3-0 SH 27X BRD (SUTURE) IMPLANT
SUT VICRYL 0 UR6 27IN ABS (SUTURE) ×6 IMPLANT
SUT VLOC 90 0 VL 30X27 GL22 (SUTURE) IMPLANT
SUT VLOC 90 2/L VL 12 GS22 (SUTURE) ×6 IMPLANT
SUTURE MNCRL 4-0 27XMF (SUTURE) ×3 IMPLANT
SUTURE STRATFX 0 PDS+ CT-2 23 (SUTURE) IMPLANT
SYS BAG RETRIEVAL 10MM (BASKET) ×2
SYSTEM BAG RETRIEVAL 10MM (BASKET) IMPLANT
TAPE TRANSPORE STRL 2 31045 (GAUZE/BANDAGES/DRESSINGS) ×3 IMPLANT
TRAP FLUID SMOKE EVACUATOR (MISCELLANEOUS) ×3 IMPLANT
TRAY FOLEY SLVR 16FR LF STAT (SET/KITS/TRAYS/PACK) ×3 IMPLANT
WATER STERILE IRR 500ML POUR (IV SOLUTION) ×3 IMPLANT

## 2022-04-16 NOTE — Op Note (Signed)
Procedure Date:  04/16/2022  Pre-operative Diagnosis:  Reducible Umbilical hernia  Post-operative Diagnosis: Reducible Umbilical and periumbilical hernias -- 3 defects, measuring total 3.5 cm  Procedure:  Robotic assisted umbilical and periumbilical Hernias Repair with mesh  Surgeon:  Melvyn Neth, MD  Anesthesia:  General endotracheal  Estimated Blood Loss:  10 ml  Specimens:  None  Complications:  None  Indications for Procedure:  This is a 32 y.o. male who presents with an umbilical hernia.  The options of surgery versus observation were reviewed with the patient and/or family. The risks of bleeding, abscess or infection, recurrence of symptoms, potential for an open procedure, injury to surrounding structures, and chronic pain were all discussed with the patient and was willing to proceed.  Description of Procedure: The patient was correctly identified in the preoperative area and brought into the operating room.  The patient was placed supine with VTE prophylaxis in place.  Appropriate time-outs were performed.  Anesthesia was induced and the patient was intubated.  Appropriate antibiotics were infused.  The abdomen was prepped and draped in a sterile fashion. The patient's hernia defect was marked with a marking pen.  A Veress needle was introduced in the left upper quadrant and pneumoperitoneum was obtained with appropriate pressures.  Using Optiview technique, an 8 mm port was introduced in the left lateral abdominal wall without complications.  Then, a 12 mm port was introduced in the left upper quadrant and an 8 mm port in the left lower quadrant under direct visualization.  The DaVinci platform was docked, camera targeted, and instruments placed under direct visualization.  The patient was found to have an umbilical and supraumbilical hernia.  These were fully reduced and the peritoneum and preperitoneal fat were dissected and resected to allow better exposure of the hernia  defect and for better mesh placement.  The patient had a total of 3 defects, all small, which overall length measured 3.5 cm.  A 4.5 inch Bard Ventralight ST Echo mesh, a 0 Stratafix suture, and two 2-0 V-loc sutures were inserted through the 12 mm port under direct visualization. The hernia defects were closed using the stratafix suture.  A PMI was brought through the center of the hernia defect and the positioning system of the mesh was passed through.  This allowed the mesh to splay open and be centered over the repair site with good overlap.  The mesh was then sutured in place circumferentially and through the center of the mesh using the V-loc sutures.  All needles and the positioning system were then removed through the 12 mm port without complications.  The preperitoneal fat was placed in an Endocatch bag.  The DaVinci platform was then undocked and instruments removed.    60 ml of Exparel solution mixed with 0.5% bupivacaine with epi was infiltrated around the mesh edges, hernia repair site, and port sites.  The endocatch bag was removed and the 12 mm port was removed and the fascia was closed under direct visualization utilizing an Endo Close technique with 0 Vicryl suture.  The 8 mm ports were removed. The 12 mm incision was closed using 3-0 Vicryl and 4-0 Monocryl, and the other port incisions were closed with 4-0 Monocryl.  The wounds were cleaned and sealed with DermaBond.  The patient was emerged from anesthesia and extubated and brought to the recovery room for further management.  The patient tolerated the procedure well and all counts were correct at the end of the case.   [employer]  Wynne Dust, MD

## 2022-04-16 NOTE — Anesthesia Procedure Notes (Signed)
Procedure Name: Intubation Date/Time: 04/16/2022 12:02 PM  Performed by: Johnna Acosta, CRNAPre-anesthesia Checklist: Patient identified, Emergency Drugs available, Suction available, Patient being monitored and Timeout performed Patient Re-evaluated:Patient Re-evaluated prior to induction Oxygen Delivery Method: Circle system utilized Preoxygenation: Pre-oxygenation with 100% oxygen Induction Type: IV induction Ventilation: Mask ventilation without difficulty Laryngoscope Size: McGraph and 3 Grade View: Grade I Tube type: Oral Tube size: 7.0 mm Number of attempts: 1 Airway Equipment and Method: Stylet and Video-laryngoscopy Placement Confirmation: ETT inserted through vocal cords under direct vision, positive ETCO2 and breath sounds checked- equal and bilateral Secured at: 20 cm Tube secured with: Tape Dental Injury: Teeth and Oropharynx as per pre-operative assessment

## 2022-04-16 NOTE — Anesthesia Preprocedure Evaluation (Addendum)
Anesthesia Evaluation  Patient identified by MRN, date of birth, ID band Patient awake    Reviewed: Allergy & Precautions, NPO status , Patient's Chart, lab work & pertinent test results  History of Anesthesia Complications Negative for: history of anesthetic complications  Airway Mallampati: III   Neck ROM: Full    Dental  (+)    Pulmonary Current Smoker (1 ppd) and Patient abstained from smoking.   Pulmonary exam normal breath sounds clear to auscultation       Cardiovascular Exercise Tolerance: Good negative cardio ROS Normal cardiovascular exam Rhythm:Regular Rate:Normal     Neuro/Psych negative neurological ROS     GI/Hepatic negative GI ROS,,,  Endo/Other  negative endocrine ROS    Renal/GU negative Renal ROS     Musculoskeletal   Abdominal   Peds  Hematology negative hematology ROS (+)   Anesthesia Other Findings   Reproductive/Obstetrics                             Anesthesia Physical Anesthesia Plan  ASA: 2  Anesthesia Plan: General   Post-op Pain Management:    Induction: Intravenous  PONV Risk Score and Plan: 1 and Propofol infusion, TIVA and Treatment may vary due to age or medical condition  Airway Management Planned: Natural Airway  Additional Equipment:   Intra-op Plan:   Post-operative Plan:   Informed Consent: I have reviewed the patients History and Physical, chart, labs and discussed the procedure including the risks, benefits and alternatives for the proposed anesthesia with the patient or authorized representative who has indicated his/her understanding and acceptance.       Plan Discussed with: CRNA  Anesthesia Plan Comments: (LMA/GETA backup discussed.  Patient consented for risks of anesthesia including but not limited to:  - adverse reactions to medications - damage to eyes, teeth, lips or other oral mucosa - nerve damage due to positioning   - sore throat or hoarseness - damage to heart, brain, nerves, lungs, other parts of body or loss of life  Informed patient about role of CRNA in peri- and intra-operative care.  Patient voiced understanding.)       Anesthesia Quick Evaluation

## 2022-04-16 NOTE — Anesthesia Postprocedure Evaluation (Signed)
Anesthesia Post Note  Patient: Jeffrey Tanner  Procedure(s) Performed: XI ROBOT ASSISTED UMBILICAL HERNIA REPAIR INSERTION OF MESH (Abdomen)  Patient location during evaluation: PACU Anesthesia Type: General Level of consciousness: awake and alert, oriented and patient cooperative Pain management: pain level controlled Vital Signs Assessment: post-procedure vital signs reviewed and stable Respiratory status: spontaneous breathing, nonlabored ventilation and respiratory function stable Cardiovascular status: blood pressure returned to baseline and stable Postop Assessment: adequate PO intake Anesthetic complications: no   There were no known notable events for this encounter.   Last Vitals:  Vitals:   04/16/22 1439 04/16/22 1445  BP:  125/75  Pulse: 77 77  Resp: 14 15  Temp:    SpO2: 99% 99%    Last Pain:  Vitals:   04/16/22 1445  TempSrc:   PainSc: [place]

## 2022-04-16 NOTE — Discharge Instructions (Signed)

## 2022-04-16 NOTE — Interval H&P Note (Signed)
History and Physical Interval Note:  04/16/2022 11:31 AM  Jeffrey Tanner  has presented today for surgery, with the diagnosis of umbilical hernia less 3 cm reducible.  The various methods of treatment have been discussed with the patient and family. After consideration of risks, benefits and other options for treatment, the patient has consented to  Procedure(s): XI Plush (N/A) as a surgical intervention.  The patient's history has been reviewed, patient examined, no change in status, stable for surgery.  I have reviewed the patient's chart and labs.  Questions were answered to the patient's satisfaction.     Tanishka Drolet

## 2022-04-16 NOTE — Transfer of Care (Signed)
Immediate Anesthesia Transfer of Care Note  Patient: Jeffrey Tanner  Procedure(s) Performed: XI ROBOT ASSISTED UMBILICAL HERNIA REPAIR INSERTION OF MESH (Abdomen)  Patient Location: PACU  Anesthesia Type:General  Level of Consciousness: awake, alert , and oriented  Airway & Oxygen Therapy: Patient Spontanous Breathing  Post-op Assessment: Report given to RN and Post -op Vital signs reviewed and stable  Post vital signs: stable  Last Vitals:  Vitals Value Taken Time  BP    Temp    Pulse    Resp    SpO2      Last Pain:  Vitals:   04/16/22 1008  TempSrc: Tympanic  PainSc: 0-No pain         Complications: No notable events documented.

## 2022-04-17 ENCOUNTER — Encounter: Payer: Self-pay | Admitting: Surgery

## 2022-04-22 ENCOUNTER — Telehealth: Payer: Self-pay | Admitting: Gastroenterology

## 2022-04-22 NOTE — Telephone Encounter (Signed)
Pt is having severe stomach pain and has an appointment for 04/29/2022 and was trying to be seen today if possible [phone]

## 2022-04-22 NOTE — Telephone Encounter (Signed)
Called patient to let him know that we were not able to see him today but that we could try to work him in tomorrow in the afternoon at 2:45 PM. If patient calls back, please offer him that appointment slot. Thank you.

## 2022-04-23 ENCOUNTER — Telehealth: Payer: Self-pay

## 2022-04-23 NOTE — Telephone Encounter (Signed)
S/P Ventral hernia repair 04/16/2022 with Dr.Piscoya- patient called GI and complained of abdominal pain- left message to return call to office- what is he currently using for pain- if the Oxycodone is not helping -per Dr.Piscoya Fleril can be sent to his pharmacy-please see staff message from [place] spoke with patient-he stated there was a miscommunication with he and his mother-he needed to make an appointment and his mother took that he was in much pain- when asked about his pain he said only when he coughed or sneezed- we discussed holding a small pillow close to his abdomen when coughing or sneezing this would help-patient stated he did not need any pain medication.

## 2022-04-23 NOTE — Telephone Encounter (Signed)
Called patient back and his answering machine came out again. I sent Dr. Hampton Abbot a message since patient recently had an umbilical hernia repaired on 04/16/2022 by Dr. Hampton Abbot. I just wanted to make sure that his office could contact him too as his sever abdominal pain could be coming from his hernia repair. Hope to hear back from the patient again.

## 2022-04-24 ENCOUNTER — Other Ambulatory Visit: Payer: Self-pay

## 2022-04-24 MED ORDER — CYCLOBENZAPRINE HCL 5 MG PO TABS: 5.0000 mg | ORAL_TABLET | Freq: Three times a day (TID) | ORAL | 0 refills | Status: AC | PRN

## 2022-04-24 NOTE — Telephone Encounter (Signed)
Patient called back and decided that he would like to get some pain medication  He uses [facility] in [place]

## 2022-04-24 NOTE — Telephone Encounter (Signed)
Flexeril 5 mg sent to pharmacy. Patient notified.

## 2022-04-29 ENCOUNTER — Encounter: Payer: Self-pay | Admitting: Gastroenterology

## 2022-04-29 ENCOUNTER — Ambulatory Visit (INDEPENDENT_AMBULATORY_CARE_PROVIDER_SITE_OTHER): Payer: 59 | Admitting: Gastroenterology

## 2022-04-29 VITALS — BP 147/88 | HR 96 | Temp 97.4°F | Wt 155.1 lb

## 2022-04-29 DIAGNOSIS — Z8719 Personal history of other diseases of the digestive system: Secondary | ICD-10-CM | POA: Diagnosis not present

## 2022-04-29 NOTE — Progress Notes (Signed)
Jonathon Bellows MD, MRCP(U.K) [address]  [place]  [place], [place] [address]  Main: [phone]  Fax: [phone]   Primary Care Physician: Pcp, No  Primary Gastroenterologist:  Dr. Jonathon Bellows   Chief Complaint  Patient presents with   Abdominal Pain    HPI: Jeffrey Tanner is a 32 y.o. male   Summary of history :  He was seen today referred and seen in November 2023 for discomfort over his umbilicus he had noticed a bulge.  Subsequently obtained an ultrasound of his abdomen limited to the area of the bowel and found a 2.3 cm ventral defect with a hernia sac.  He subsequently was seen by Dr. Hampton Abbot and underwent umbilical hernia repair..  Interval history   04/10/2022-04/29/2022  Doing well after surgery but has resolved minimal discomfort at the surgery site no other complaints.  Current Outpatient Medications  Medication Sig Dispense Refill   acetaminophen (TYLENOL) 500 MG tablet Take 2 tablets (1,000 mg total) by mouth every 6 (six) hours as needed for mild pain.     cyclobenzaprine (FLEXERIL) 5 MG tablet Take 1 tablet (5 mg total) by mouth 3 (three) times daily as needed for muscle spasms. 30 tablet 0   ibuprofen (ADVIL) 800 MG tablet Take 1 tablet (800 mg total) by mouth every 8 (eight) hours as needed for moderate pain. 60 tablet 1   oxyCODONE (OXY IR/ROXICODONE) 5 MG immediate release tablet Take 1 tablet (5 mg total) by mouth every 4 (four) hours as needed for severe pain. 30 tablet 0   No current facility-administered medications for this visit.    Allergies as of 04/29/2022   (No Known Allergies)    ROS:  General: Negative for anorexia, weight loss, fever, chills, fatigue, weakness. ENT: Negative for hoarseness, difficulty swallowing , nasal congestion. CV: Negative for chest pain, angina, palpitations, dyspnea on exertion, peripheral edema.  Respiratory: Negative for dyspnea at rest, dyspnea on exertion, cough, sputum, wheezing.  GI: See history of  present illness. GU:  Negative for dysuria, hematuria, urinary incontinence, urinary frequency, nocturnal urination.  Endo: Negative for unusual weight change.    Physical Examination:   BP (!) 147/88   Pulse 96   Temp (!) 97.4 F (36.3 C) (Oral)   Wt 155 lb 1.6 oz (70.4 kg)   BMI 20.46 kg/m   General: Well-nourished, well-developed in no acute distress.  Eyes: No icterus. Conjunctivae pink. Abdomen he well-healed scar at the umbilicus and another well-healed scar at the left upper quadrant no tenderness no evidence of inflammation.   Imaging Studies: No results found.  Assessment and Plan:   Jeffrey Tanner is a 32 y.o. y/o male here to follow-up for an umbilical hernia that was repaired by Dr. Hampton Abbot doing well and no complaints    Dr Jonathon Bellows  MD,MRCP [facility]) Follow up in as needed

## 2022-04-30 ENCOUNTER — Encounter: Payer: 59 | Admitting: Physician Assistant
